# Patient Record
Sex: Female | Born: 1940 | Race: Black or African American | Hispanic: No | Marital: Married | Smoking: Never smoker
Health system: Southern US, Community
[De-identification: ages and names within clinical notes are randomized; demographics above are authoritative.]

## PROBLEM LIST (undated history)

## (undated) DIAGNOSIS — I10 Essential (primary) hypertension: Secondary | ICD-10-CM

## (undated) DIAGNOSIS — E079 Disorder of thyroid, unspecified: Secondary | ICD-10-CM

## (undated) HISTORY — DX: Disorder of thyroid, unspecified: E07.9

## (undated) HISTORY — DX: Essential (primary) hypertension: I10

---

## 1998-05-03 ENCOUNTER — Emergency Department (HOSPITAL_COMMUNITY): Admission: EM | Admit: 1998-05-03 | Discharge: 1998-05-03 | Payer: Self-pay | Admitting: Emergency Medicine

## 1999-04-24 ENCOUNTER — Encounter: Admission: RE | Admit: 1999-04-24 | Discharge: 1999-04-24 | Payer: Self-pay | Admitting: Family Medicine

## 1999-04-24 ENCOUNTER — Encounter: Payer: Self-pay | Admitting: Family Medicine

## 1999-06-04 ENCOUNTER — Other Ambulatory Visit: Admission: RE | Admit: 1999-06-04 | Discharge: 1999-06-04 | Payer: Self-pay | Admitting: Family Medicine

## 2000-07-06 ENCOUNTER — Encounter: Admission: RE | Admit: 2000-07-06 | Discharge: 2000-07-06 | Payer: Self-pay | Admitting: Family Medicine

## 2000-07-06 ENCOUNTER — Encounter: Payer: Self-pay | Admitting: Family Medicine

## 2001-01-17 ENCOUNTER — Encounter: Admission: RE | Admit: 2001-01-17 | Discharge: 2001-01-17 | Payer: Self-pay | Admitting: Family Medicine

## 2001-01-17 ENCOUNTER — Encounter: Payer: Self-pay | Admitting: Family Medicine

## 2003-05-16 ENCOUNTER — Encounter: Admission: RE | Admit: 2003-05-16 | Discharge: 2003-05-16 | Payer: Self-pay | Admitting: Family Medicine

## 2006-10-12 ENCOUNTER — Encounter: Admission: RE | Admit: 2006-10-12 | Discharge: 2006-10-12 | Payer: Self-pay | Admitting: Family Medicine

## 2008-02-08 ENCOUNTER — Encounter: Admission: RE | Admit: 2008-02-08 | Discharge: 2008-02-08 | Payer: Self-pay | Admitting: Family Medicine

## 2009-01-22 ENCOUNTER — Encounter: Admission: RE | Admit: 2009-01-22 | Discharge: 2009-01-22 | Payer: Self-pay | Admitting: Family Medicine

## 2010-03-30 ENCOUNTER — Encounter: Payer: Self-pay | Admitting: Family Medicine

## 2011-08-31 ENCOUNTER — Telehealth: Payer: Self-pay | Admitting: Medical Oncology

## 2011-08-31 NOTE — Telephone Encounter (Signed)
wrong chart

## 2012-05-23 ENCOUNTER — Encounter: Payer: Self-pay | Admitting: Obstetrics and Gynecology

## 2012-12-22 ENCOUNTER — Ambulatory Visit (INDEPENDENT_AMBULATORY_CARE_PROVIDER_SITE_OTHER): Payer: Medicare HMO | Admitting: Obstetrics & Gynecology

## 2012-12-22 ENCOUNTER — Encounter: Payer: Self-pay | Admitting: Obstetrics & Gynecology

## 2012-12-22 VITALS — BP 146/83 | HR 47 | Temp 97.8°F | Ht 63.0 in | Wt 152.6 lb

## 2012-12-22 DIAGNOSIS — N812 Incomplete uterovaginal prolapse: Secondary | ICD-10-CM

## 2012-12-22 NOTE — Progress Notes (Signed)
Patient ID: Colleen Jones, female   DOB: 1940-05-09, 72 y.o.   MRN: [id number]  Chief Complaint  Patient presents with  . Vaginal Bleeding    spotting   11 months of sx of pelvic prolapse, irritation and some spotting. No hematuria, vaginal discharge, hematochezia, pain.  HPI Colleen Jones is a 72 y.o. female.  [id number], LMP age 38.   HPI  Past Medical History  Diagnosis Date  . Hypertension     History reviewed. No pertinent past surgical history.  History reviewed. No pertinent family history.  Social History History  Substance Use Topics  . Smoking status: Never Smoker   . Smokeless tobacco: Never Used  . Alcohol Use: No    Not on File  Current Outpatient Prescriptions  Medication Sig Dispense Refill  . atenolol (TENORMIN) 50 MG tablet Take 50 mg by mouth daily.      . calcium carbonate (OS-CAL) 1250 MG chewable tablet Chew 1 tablet by mouth daily.      . cholecalciferol (VITAMIN D) 1000 UNITS tablet Take 1,000 Units by mouth daily.      Marland Kitchen levothyroxine (SYNTHROID, LEVOTHROID) 75 MCG tablet Take 75 mcg by mouth daily before breakfast.       No current facility-administered medications for this visit.    Review of Systems Review of Systems  Genitourinary: Positive for vaginal bleeding (irritation), difficulty urinating and vaginal pain. Negative for dysuria, urgency, hematuria (spotting), vaginal discharge, menstrual problem and pelvic pain.    Blood pressure 146/83, pulse 47, temperature 97.8 F (36.6 C), temperature source Oral, height 5\' 3"  (1.6 m), weight 152 lb 9.6 oz (69.219 kg).  Physical Exam Physical Exam  Constitutional: She appears well-developed. She appears distressed.  Pulmonary/Chest: No respiratory distress.  Abdominal: Soft. There is no tenderness.  Genitourinary: No vaginal discharge found.  2-3 degree utero vaginal prolapse, no mass  Skin: Skin is warm and dry.  Psychiatric: Her behavior is normal.    Data Reviewed Referral note,  pap  Assessment    Uterovaginal prolapse with irritation and spotting     Plan    Pessary fitting        Lucette Kratz 12/22/2012, 4:16 PM

## 2012-12-22 NOTE — Progress Notes (Signed)
Pt. States she has been spotting since 2013 ("before christmas") and her "femal parts are starting to hang;" reports feeling irritation but no pain.  Pt. States she has not taken ber BP medication today.

## 2012-12-22 NOTE — Patient Instructions (Signed)
Prolapse  Prolapse means the falling down, bulging, dropping, or drooping of a body part. Organs that commonly prolapse include the rectum, small intestine, bladder, urethra, vagina (birth canal), uterus (womb), and cervix. Prolapse occurs when the ligaments and muscle tissue around the rectum, bladder, and uterus are damaged or weakened.  CAUSES  This happens especially with:  Childbirth. Some women feel pelvic pressure or have trouble holding their urine right after childbirth, because of stretching and tearing of pelvic tissues. This generally gets better with time and the feeling usually goes away, but it may return with aging.  Chronic heavy lifting.  Aging.  Menopause, with loss of estrogen production weakening the pelvic ligaments and muscles.  Past pelvic surgery.  Obesity.  Chronic constipation.  Chronic cough. Prolapse may affect a single organ, or several organs may prolapse at the same time. The front wall of the vagina holds up the bladder. The back wall holds up part of the lower intestine, or rectum. The uterus fills a spot in the middle. All these organs can be involved when the ligaments and muscles around the vagina relax too much. This often gets worse when women stop producing estrogen (menopause). SYMPTOMS  Uncontrolled loss of urine (incontinence) with cough, sneeze, straining, and exercise.  More force may be required to have a bowel movement, due to trapping of the stool.  When part of an organ bulges through the opening of the vagina, there is sometimes a feeling of heaviness or pressure. It may feel as though something is falling out. This sensation increases with coughing or bearing down.  If the organs protrude through the opening of the vagina and rub against the clothing, there may be soreness, ulcers, infection, pain, and bleeding.  Lower back pain.  Pushing in the upper or lower part of the vagina, to pass urine or have a bowel movement.  Problems  having sexual intercourse.  Being unable to insert a tampon or applicator. DIAGNOSIS  Usually, a physical exam is all that is needed to identify the problem. During the examination, you may be asked to cough and strain while lying down, sitting up, and standing up. Your caregiver will determine if more testing is required, such as bladder function tests. Some diagnoses are:  Cystocele: Bulging and falling of the bladder into the top of the vagina.  Rectocele: Part of the rectum bulging into the vagina.  Prolapse of the uterus: The uterus falls or drops into the vagina.  Enterocele: Bulging of the top of the vagina, after a hysterectomy (uterus removal), with the small intestine bulging into the vagina. A hernia in the top of the vagina.  Urethrocele: The urethra (urine carrying tube) bulging into the vagina. TREATMENT  In most cases, prolapse needs to be treated only if it produces symptoms. If the symptoms are interfering with your usual daily or sexual activities, treatment may be necessary. The following are some measures that may be used to treat prolapse.  Estrogen may help elderly women with mild prolapse.  Kegel exercises may help mild cases of prolapse, by strengthening and tightening the muscles of the pelvic floor.  Pessaries are used in women who choose not to, or are unable to, have surgery. A pessary is a doughnut-shaped piece of plastic or rubber that is put into the vagina to keep the organs in place. This device must be fitted by your caregiver. Your caregiver will also explain how to care for yourself with the pessary. If it works well for you,   this may be the only treatment required.  Surgery is often the only form of treatment for more severe prolapses. There are different types of surgery available. You should discuss what the best procedure is for you. If the uterus is prolapsed, it may be removed (hysterectomy) as part of the surgical treatment. Your caregiver will  discuss the risks and benefits with you.  Uterine-vaginal suspension (surgery to hold up the organs) may be used, especially if you want to maintain your fertility. No form of treatment is guaranteed to correct the prolapse or relieve the symptoms. HOME CARE INSTRUCTIONS   Wear a sanitary pad or absorbent product if you have incontinence of urine.  Avoid heavy lifting and straining with exercise and work.  Take over-the-counter pain medicine for minor discomfort.  Try taking estrogen or using estrogen vaginal cream.  Try Kegel exercises or use a pessary, before deciding to have surgery.  Do Kegel exercises after having a baby. SEEK MEDICAL CARE IF:   Your symptoms interfere with your daily activities.  You need medicine to help with the discomfort.  You need to be fitted with a pessary.  You notice bleeding from the vagina.  You think you have ulcers or you notice ulcers on the cervix.  You have an oral temperature above 102 F (38.9 C).  You develop pain or blood with urination.  You have bleeding with a bowel movement.  The symptoms are interfering with your sex life.  You have urinary incontinence that interferes with your daily activities.  You lose urine with sexual intercourse.  You have a chronic cough.  You have chronic constipation. Document Released: 08/30/2002 Document Revised: 05/18/2011 Document Reviewed: 03/10/2009 [employer] Patient Information 2014 [employer].  

## 2013-01-16 ENCOUNTER — Ambulatory Visit (INDEPENDENT_AMBULATORY_CARE_PROVIDER_SITE_OTHER): Payer: Medicare HMO | Admitting: Obstetrics & Gynecology

## 2013-01-16 ENCOUNTER — Encounter: Payer: Self-pay | Admitting: Obstetrics & Gynecology

## 2013-01-16 VITALS — BP 127/80 | HR 49 | Temp 97.5°F | Ht 63.0 in | Wt 151.4 lb

## 2013-01-16 DIAGNOSIS — N812 Incomplete uterovaginal prolapse: Secondary | ICD-10-CM

## 2013-01-16 NOTE — Progress Notes (Signed)
Patient ID: Colleen Jones, female   DOB: 27-Apr-1940, 72 y.o.   MRN: [id number] Patient consents to pessary fitting. I explained the procedure. & cm ring with support placed and good fit assured. She will return in 1 week to assess tolerance.    Adam Phenix, MD 01/16/2013 4:48 PM

## 2013-01-26 ENCOUNTER — Ambulatory Visit: Payer: Medicare HMO | Admitting: Obstetrics & Gynecology

## 2013-01-27 ENCOUNTER — Encounter: Payer: Self-pay | Admitting: Obstetrics & Gynecology

## 2013-01-27 ENCOUNTER — Ambulatory Visit (INDEPENDENT_AMBULATORY_CARE_PROVIDER_SITE_OTHER): Payer: Medicare HMO | Admitting: Obstetrics & Gynecology

## 2013-01-27 VITALS — BP 166/80 | HR 47 | Temp 97.1°F | Ht 63.0 in | Wt 155.2 lb

## 2013-01-27 DIAGNOSIS — N812 Incomplete uterovaginal prolapse: Secondary | ICD-10-CM

## 2013-01-27 NOTE — Progress Notes (Signed)
Patient was uncomfortable with pessary, which she removed and brought with her today. I placed a 5 cm ring no support and she will return in 2 weeks for evaluation.  Adam Phenix, MD 01/27/2013

## 2013-01-27 NOTE — Patient Instructions (Signed)
See your instructions given last visit.  Colleen Phenix, MD

## 2013-03-29 ENCOUNTER — Encounter: Payer: Self-pay | Admitting: Obstetrics & Gynecology

## 2013-03-29 ENCOUNTER — Ambulatory Visit (INDEPENDENT_AMBULATORY_CARE_PROVIDER_SITE_OTHER): Payer: Medicare HMO | Admitting: Obstetrics & Gynecology

## 2013-03-29 VITALS — BP 139/74 | HR 52 | Temp 97.5°F | Ht 64.0 in | Wt 152.5 lb

## 2013-03-29 DIAGNOSIS — N812 Incomplete uterovaginal prolapse: Secondary | ICD-10-CM

## 2013-03-29 NOTE — Progress Notes (Signed)
   Subjective:    Patient ID: Colleen Jones, female    DOB: December 22, 1940, 73 y.o.   MRN: [id number]  [id number] No LMP recorded. Patient is postmenopausal. vulvo vaginal prolapse. She kept 5 ring pessary in for 1-2 weeks and removed for discomfort.    Review of Systems  Genitourinary: Positive for vaginal pain. Negative for urgency, vaginal bleeding and vaginal discharge.       Objective:   Physical Exam  Constitutional: She appears well-developed. No distress.  Abdominal: Soft.  Genitourinary:  Uterine prolapse, 4 donut placed  Neurological: She is alert.  Skin: Skin is warm.  Psychiatric: She has a normal mood and affect. Her behavior is normal.          Assessment & Plan:  Pelvic organ prolapse trying pessary, RTC 1 mo  Adam PhenixJames G Arnold, MD 03/29/2013

## 2013-05-15 ENCOUNTER — Ambulatory Visit (INDEPENDENT_AMBULATORY_CARE_PROVIDER_SITE_OTHER): Payer: Commercial Managed Care - HMO | Admitting: Obstetrics & Gynecology

## 2013-05-15 ENCOUNTER — Encounter: Payer: Self-pay | Admitting: Obstetrics & Gynecology

## 2013-05-15 VITALS — BP 129/78 | HR 48 | Temp 97.4°F | Ht 64.0 in | Wt 150.8 lb

## 2013-05-15 DIAGNOSIS — N812 Incomplete uterovaginal prolapse: Secondary | ICD-10-CM

## 2013-05-15 NOTE — Progress Notes (Signed)
Patient ID: [name], female   DOB: 1940-10-02, 73 y.o.   MRN: [id number] Pessary stayed in place for 2 weeks then fell out, no prolapse for another 2 weeks,. She will reinsert at home and return to examine in 3 weeks.  Adam PhenixJames G Cade Dashner, MD 05/15/2013

## 2013-07-17 ENCOUNTER — Other Ambulatory Visit: Payer: Self-pay | Admitting: Medical Oncology

## 2013-07-17 NOTE — Progress Notes (Signed)
erroneous

## 2014-01-08 ENCOUNTER — Encounter: Payer: Self-pay | Admitting: Obstetrics & Gynecology

## 2014-05-08 DIAGNOSIS — H5210 Myopia, unspecified eye: Secondary | ICD-10-CM | POA: Diagnosis not present

## 2014-05-08 DIAGNOSIS — H524 Presbyopia: Secondary | ICD-10-CM | POA: Diagnosis not present

## 2014-11-07 DIAGNOSIS — Z0000 Encounter for general adult medical examination without abnormal findings: Secondary | ICD-10-CM | POA: Diagnosis not present

## 2014-11-07 DIAGNOSIS — Z23 Encounter for immunization: Secondary | ICD-10-CM | POA: Diagnosis not present

## 2014-11-07 DIAGNOSIS — D649 Anemia, unspecified: Secondary | ICD-10-CM | POA: Diagnosis not present

## 2014-11-07 DIAGNOSIS — R7301 Impaired fasting glucose: Secondary | ICD-10-CM | POA: Diagnosis not present

## 2014-11-07 DIAGNOSIS — E785 Hyperlipidemia, unspecified: Secondary | ICD-10-CM | POA: Diagnosis not present

## 2014-11-07 DIAGNOSIS — I10 Essential (primary) hypertension: Secondary | ICD-10-CM | POA: Diagnosis not present

## 2014-11-07 DIAGNOSIS — E039 Hypothyroidism, unspecified: Secondary | ICD-10-CM | POA: Diagnosis not present

## 2014-11-30 DIAGNOSIS — Z1231 Encounter for screening mammogram for malignant neoplasm of breast: Secondary | ICD-10-CM | POA: Diagnosis not present

## 2015-02-06 DIAGNOSIS — E039 Hypothyroidism, unspecified: Secondary | ICD-10-CM | POA: Diagnosis not present

## 2015-02-06 DIAGNOSIS — I10 Essential (primary) hypertension: Secondary | ICD-10-CM | POA: Diagnosis not present

## 2015-05-30 DIAGNOSIS — E039 Hypothyroidism, unspecified: Secondary | ICD-10-CM | POA: Diagnosis not present

## 2015-05-30 DIAGNOSIS — I10 Essential (primary) hypertension: Secondary | ICD-10-CM | POA: Diagnosis not present

## 2015-05-30 DIAGNOSIS — J309 Allergic rhinitis, unspecified: Secondary | ICD-10-CM | POA: Diagnosis not present

## 2015-05-30 DIAGNOSIS — Z049 Encounter for examination and observation for unspecified reason: Secondary | ICD-10-CM | POA: Diagnosis not present

## 2015-12-04 DIAGNOSIS — Z0000 Encounter for general adult medical examination without abnormal findings: Secondary | ICD-10-CM | POA: Diagnosis not present

## 2015-12-04 DIAGNOSIS — D509 Iron deficiency anemia, unspecified: Secondary | ICD-10-CM | POA: Diagnosis not present

## 2015-12-04 DIAGNOSIS — Z23 Encounter for immunization: Secondary | ICD-10-CM | POA: Diagnosis not present

## 2015-12-04 DIAGNOSIS — I10 Essential (primary) hypertension: Secondary | ICD-10-CM | POA: Diagnosis not present

## 2016-01-14 DIAGNOSIS — D649 Anemia, unspecified: Secondary | ICD-10-CM | POA: Diagnosis not present

## 2016-02-11 ENCOUNTER — Telehealth: Payer: Self-pay | Admitting: Hematology and Oncology

## 2016-02-11 ENCOUNTER — Encounter: Payer: Self-pay | Admitting: Hematology and Oncology

## 2016-02-11 NOTE — Telephone Encounter (Signed)
Pt returned call and confirmed appt, verified demo and insurance, mailed pt letter, faxed referring appt date/time. Requested [employer] auth.

## 2016-02-19 ENCOUNTER — Telehealth: Payer: Self-pay | Admitting: Hematology and Oncology

## 2016-02-19 NOTE — Telephone Encounter (Signed)
Lt pt vm regarding [employer] autho, reached out to PCP listed on card and lt vm to obtain [employer] auth. Contacted [employer] insurance to confirm PCP and contact info. (ZOX#[id number](ref#[id number])

## 2016-02-20 ENCOUNTER — Telehealth: Payer: Self-pay | Admitting: Hematology and Oncology

## 2016-02-20 ENCOUNTER — Ambulatory Visit (HOSPITAL_BASED_OUTPATIENT_CLINIC_OR_DEPARTMENT_OTHER): Payer: Commercial Managed Care - HMO | Admitting: Hematology and Oncology

## 2016-02-20 ENCOUNTER — Ambulatory Visit (HOSPITAL_BASED_OUTPATIENT_CLINIC_OR_DEPARTMENT_OTHER): Payer: Commercial Managed Care - HMO

## 2016-02-20 ENCOUNTER — Encounter: Payer: Self-pay | Admitting: Hematology and Oncology

## 2016-02-20 DIAGNOSIS — I10 Essential (primary) hypertension: Secondary | ICD-10-CM

## 2016-02-20 DIAGNOSIS — D509 Iron deficiency anemia, unspecified: Secondary | ICD-10-CM

## 2016-02-20 DIAGNOSIS — D539 Nutritional anemia, unspecified: Secondary | ICD-10-CM | POA: Diagnosis not present

## 2016-02-20 LAB — CBC & DIFF AND RETIC
BASO%: 0.4 % (ref 0.0–2.0)
Basophils Absolute: 0 10*3/uL (ref 0.0–0.1)
EOS%: 1.3 % (ref 0.0–7.0)
Eosinophils Absolute: 0.1 10*3/uL (ref 0.0–0.5)
HCT: 33.8 % — ABNORMAL LOW (ref 34.8–46.6)
HEMOGLOBIN: 10.9 g/dL — AB (ref 11.6–15.9)
IMMATURE RETIC FRACT: 3.4 % (ref 1.60–10.00)
LYMPH%: 46.4 % (ref 14.0–49.7)
MCH: 25 pg — AB (ref 25.1–34.0)
MCHC: 32.2 g/dL (ref 31.5–36.0)
MCV: 77.5 fL — AB (ref 79.5–101.0)
MONO#: 0.3 10*3/uL (ref 0.1–0.9)
MONO%: 5.9 % (ref 0.0–14.0)
NEUT%: 46 % (ref 38.4–76.8)
NEUTROS ABS: 2.1 10*3/uL (ref 1.5–6.5)
PLATELETS: 172 10*3/uL (ref 145–400)
RBC: 4.36 10*6/uL (ref 3.70–5.45)
RDW: 14.6 % — ABNORMAL HIGH (ref 11.2–14.5)
Retic %: 0.84 % (ref 0.70–2.10)
Retic Ct Abs: 36.62 10*3/uL (ref 33.70–90.70)
WBC: 4.6 10*3/uL (ref 3.9–10.3)
lymph#: 2.1 10*3/uL (ref 0.9–3.3)

## 2016-02-20 LAB — COMPREHENSIVE METABOLIC PANEL
ALBUMIN: 3.7 g/dL (ref 3.5–5.0)
ALK PHOS: 75 U/L (ref 40–150)
ALT: 20 U/L (ref 0–55)
ANION GAP: 10 meq/L (ref 3–11)
AST: 28 U/L (ref 5–34)
BILIRUBIN TOTAL: 0.76 mg/dL (ref 0.20–1.20)
BUN: 17.9 mg/dL (ref 7.0–26.0)
CO2: 26 mEq/L (ref 22–29)
Calcium: 9.3 mg/dL (ref 8.4–10.4)
Chloride: 104 mEq/L (ref 98–109)
Creatinine: 1.1 mg/dL (ref 0.6–1.1)
EGFR: 57 mL/min/{1.73_m2} — AB (ref >90)
Glucose: 84 mg/dl (ref 70–140)
POTASSIUM: 3.6 meq/L (ref 3.5–5.1)
Sodium: 140 mEq/L (ref 136–145)
TOTAL PROTEIN: 8 g/dL (ref 6.4–8.3)

## 2016-02-20 NOTE — Progress Notes (Signed)
[facility] CONSULT NOTE  Patient Care Team: Leonard Downing, MD as PCP - General (Family Medicine)  CHIEF COMPLAINTS/PURPOSE OF CONSULTATION:  Chronic anemia  HISTORY OF PRESENTING ILLNESS:  Colleen Jones 75 y.o. female is here because of diagnosis of chronic anemia  She was found to have abnormal CBC from blood count monitoring by her primary care doctor. According to outside records, she has been anemic since 2014.  On 04/28/2012, hemoglobin is 11.1. On 11/07/2014, hemoglobin is 10.9. On 12/04/2015, hemoglobin is 10.9 On 01/14/2016, hemoglobin is 10.4 Hemoglobin electrophoresis showed normal pattern. Reticulocyte count is low. Serum ferritin is at 235 She denies recent chest pain on exertion, shortness of breath on minimal exertion, pre-syncopal episodes, or palpitations. She had not noticed any recent bleeding such as epistaxis, hematuria or hematochezia The patient denies over the counter NSAID ingestion. She is not on antiplatelets agents. Her last colonoscopy was 3 years ago and she was told it was normal She had no prior history or diagnosis of cancer. Her age appropriate screening programs are up-to-date. She denies any pica and eats a variety of diet. She never donated blood or received blood transfusion The patient was prescribed oral iron supplements and she takes them on and off without improvement of her anemia She denies recent changes in appetite or weight. No recent bone pain  MEDICAL HISTORY:  Past Medical History:  Diagnosis Date  . Hypertension   . Thyroid disease     SURGICAL HISTORY: History reviewed. No pertinent surgical history.  SOCIAL HISTORY: Social History   Social History  . Marital status: Married    Spouse name: N/A  . Number of children: 5  . Years of education: N/A   Occupational History  . 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 Smoking status: Never Smoker  . Smokeless tobacco: Never  Used  . Alcohol use No  . Drug use: No  . Sexual activity: Not Currently   Other Topics Concern  . Not on file   Social History Narrative  . No narrative on file    FAMILY HISTORY: History reviewed. No pertinent family history.  ALLERGIES:  has No Known Allergies.  MEDICATIONS:  Current Outpatient Prescriptions  Medication Sig Dispense Refill  . atenolol (TENORMIN) 50 MG tablet Take 100 mg by mouth daily.     . calcium carbonate (OS-CAL) 1250 MG chewable tablet Chew 1 tablet by mouth daily.    . cholecalciferol (VITAMIN D) 1000 UNITS tablet Take 50,000 Units by mouth daily.     Marland Kitchen levothyroxine (SYNTHROID, LEVOTHROID) 75 MCG tablet Take 75 mcg by mouth daily before breakfast.    . triamterene-hydrochlorothiazide (MAXZIDE) 75-50 MG per tablet Take 0.5 tablets by mouth daily.     No current facility-administered medications for this visit.     REVIEW OF SYSTEMS:   Constitutional: Denies fevers, chills or abnormal night sweats Eyes: Denies blurriness of vision, double vision or watery eyes Ears, nose, mouth, throat, and face: Denies mucositis or sore throat Respiratory: Denies cough, dyspnea or wheezes Cardiovascular: Denies palpitation, chest discomfort or lower extremity swelling Gastrointestinal:  Denies nausea, heartburn or change in bowel habits Skin: Denies abnormal skin rashes Lymphatics: Denies new lymphadenopathy or easy bruising Neurological:Denies numbness, tingling or new weaknesses Behavioral/Psych: Mood is stable, no new changes  All other systems were reviewed with the patient and are negative.  PHYSICAL EXAMINATION: ECOG PERFORMANCE STATUS: 0 - Asymptomatic  Vitals:  02/20/16 1346  BP: (!) 183/75  Pulse: (!) 51  Resp: 18  Temp: 98 F (36.7 C)   Filed Weights   02/20/16 1346  Weight: 151 lb 1.6 oz (68.5 kg)    GENERAL:alert, no distress and comfortable SKIN: skin color, texture, turgor are normal, no rashes or significant lesions EYES: normal,  conjunctiva are pink and non-injected, sclera clear OROPHARYNX:no exudate, no erythema and lips, buccal mucosa, and tongue normal  NECK: supple, thyroid normal size, non-tender, without nodularity LYMPH:  no palpable lymphadenopathy in the cervical, axillary or inguinal LUNGS: clear to auscultation and percussion with normal breathing effort HEART: regular rate & rhythm and no murmurs and no lower extremity edema ABDOMEN:abdomen soft, non-tender and normal bowel sounds Musculoskeletal:no cyanosis of digits and no clubbing  PSYCH: alert & oriented x 3 with fluent speech NEURO: no focal motor/sensory deficits  ASSESSMENT & PLAN:  Microcytic anemia The patient had microcytic anemia. Serum hemoglobin electrophoresis was done and it was within normal limits. Serum ferritin was within normal limits but serum iron, TIBC and iron saturation was not done. She could have undiagnosed thalassemia trait. Anemia of chronic disease can cause microcytic anemia as well. Concurrent vitamin B 12 cannot be excluded. With patient with progressive anemia, I would be concerned about possible undiagnosed malignancy such as multiple myeloma I will order additional workup and see her back next week to review test results   Essential hypertension she will continue current medical management. I recommend close follow-up with primary care doctor for medication adjustment.   Orders Placed This Encounter  Procedures  . CBC & Diff and Retic    Standing Status:   Future    Number of Occurrences:   1    Standing Expiration Date:   03/26/2017  . Ferritin    Standing Status:   Future    Number of Occurrences:   1    Standing Expiration Date:   03/26/2017  . Iron and TIBC    Standing Status:   Future    Number of Occurrences:   1    Standing Expiration Date:   03/26/2017  . Vitamin B12    Standing Status:   Future    Number of Occurrences:   1    Standing Expiration Date:   03/26/2017  . Sedimentation rate     Standing Status:   Future    Number of Occurrences:   1    Standing Expiration Date:   03/26/2017  . Comprehensive metabolic panel    Standing Status:   Future    Number of Occurrences:   1    Standing Expiration Date:   03/26/2017  . Multiple Myeloma Panel (SPEP&IFE w/QIG)    Standing Status:   Future    Number of Occurrences:   1    Standing Expiration Date:   03/26/2017      All questions were answered. The patient knows to call the clinic with any problems, questions or concerns. I spent 30 minutes counseling the patient face to face. The total time spent in the appointment was 55 minutes and more than 50% was on counseling.     Heath Lark, MD 02/20/16 3:00 PM

## 2016-02-20 NOTE — Assessment & Plan Note (Addendum)
The patient had microcytic anemia. Serum hemoglobin electrophoresis was done and it was within normal limits. Serum ferritin was within normal limits but serum iron, TIBC and iron saturation was not done. She could have undiagnosed thalassemia trait. Anemia of chronic disease can cause microcytic anemia as well. Concurrent vitamin B 12 cannot be excluded. With patient with progressive anemia, I would be concerned about possible undiagnosed malignancy such as multiple myeloma I will order additional workup and see her back next week to review test results

## 2016-02-20 NOTE — Telephone Encounter (Signed)
Appointments scheduled per 12/14 LOS patient given AVS report and calendars with future scheduled appointments.  °

## 2016-02-20 NOTE — Assessment & Plan Note (Signed)
she will continue current medical management. I recommend close follow-up with primary care doctor for medication adjustment.  

## 2016-02-21 LAB — IRON AND TIBC
%SAT: 29 % (ref 21–57)
IRON: 90 ug/dL (ref 41–142)
TIBC: 313 ug/dL (ref 236–444)
UIBC: 222 ug/dL (ref 120–384)

## 2016-02-21 LAB — SEDIMENTATION RATE: SED RATE: 39 mm/h (ref 0–40)

## 2016-02-21 LAB — VITAMIN B12: VITAMIN B 12: 531 pg/mL (ref 232–1245)

## 2016-02-21 LAB — FERRITIN: Ferritin: 164 ng/ml (ref 9–269)

## 2016-02-25 LAB — MULTIPLE MYELOMA PANEL, SERUM
ALBUMIN/GLOB SERPL: 1.1 (ref 0.7–1.7)
ALPHA2 GLOB SERPL ELPH-MCNC: 0.7 g/dL (ref 0.4–1.0)
Albumin SerPl Elph-Mcnc: 3.5 g/dL (ref 2.9–4.4)
Alpha 1: 0.2 g/dL (ref 0.0–0.4)
B-GLOBULIN SERPL ELPH-MCNC: 1.1 g/dL (ref 0.7–1.3)
Gamma Glob SerPl Elph-Mcnc: 1.4 g/dL (ref 0.4–1.8)
Globulin, Total: 3.5 g/dL (ref 2.2–3.9)
IGM (IMMUNOGLOBIN M), SRM: 66 mg/dL (ref 26–217)
IgA, Qn, Serum: 264 mg/dL (ref 64–422)
Total Protein: 7 g/dL (ref 6.0–8.5)

## 2016-02-27 ENCOUNTER — Ambulatory Visit (HOSPITAL_BASED_OUTPATIENT_CLINIC_OR_DEPARTMENT_OTHER): Payer: Commercial Managed Care - HMO | Admitting: Hematology and Oncology

## 2016-02-27 DIAGNOSIS — I10 Essential (primary) hypertension: Secondary | ICD-10-CM | POA: Diagnosis not present

## 2016-02-27 DIAGNOSIS — D509 Iron deficiency anemia, unspecified: Secondary | ICD-10-CM

## 2016-02-28 ENCOUNTER — Encounter: Payer: Self-pay | Admitting: Hematology and Oncology

## 2016-02-28 NOTE — Progress Notes (Signed)
[facility] OFF[facility]ESS [facility]  Kaleen MaskELKINS,WILSON OLIVER, MD SUMMARY OF HEMATOLOGIC HISTORY:  Colleen Jones 75 y.o. female is here because of diagnosis of chronic anemia  She was found to have abnormal CBC from blood count monitoring by her primary care doctor. According to outside records, she has been anemic since 2014.  On 04/28/2012, hemoglobin is 11.1. On 11/07/2014, hemoglobin is 10.9. On 12/04/2015, hemoglobin is 10.9 On 01/14/2016, hemoglobin is 10.4 Hemoglobin electrophoresis showed normal pattern. Reticulocyte count is low. Serum ferritin is at 235 She denies recent chest pain on exertion, shortness of breath on minimal exertion, pre-syncopal episodes, or palpitations. She had not noticed any recent bleeding such as epistaxis, hematuria or hematochezia The patient denies over the counter NSAID ingestion. She is not on antiplatelets agents. Her last colonoscopy was 3 years ago and she was told it was normal She had no prior history or diagnosis of cancer. Her age appropriate screening programs are up-to-date. She denies any pica and eats a variety of diet. She never donated blood or received blood transfusion The patient was prescribed oral iron supplements and she takes them on and off without improvement of her anemia She denies recent changes in appetite or weight. No recent bone pain  INTERVAL HISTORY: Colleen Jones 75 y.o. female returns for further follow-up. She feels well.  I have reviewed the past medical history, past surgical history, social history and family history with the patient and they are unchanged from previous note.  ALLERGIES:  has No Known Allergies.  MEDICATIONS:  Current Outpatient Prescriptions  Medication Sig Dispense Refill  . atenolol (TENORMIN) 50 MG tablet Take 100 mg by mouth daily.     . calcium carbonate (OS-CAL) 1250 MG chewable tablet Chew 1 tablet by mouth daily.    . cholecalciferol (VITAMIN D) 1000 UNITS tablet Take  50,000 Units by mouth daily.     Marland Kitchen. levothyroxine (SYNTHROID, LEVOTHROID) 75 MCG tablet Take 75 mcg by mouth daily before breakfast.    . triamterene-hydrochlorothiazide (MAXZIDE) 75-50 MG per tablet Take 0.5 tablets by mouth daily.     No current facility-administered medications for this visit.      REVIEW OF SYSTEMS:   Constitutional: Denies fevers, chills or night sweats Eyes: Denies blurriness of vision Ears, nose, mouth, throat, and face: Denies mucositis or sore throat Respiratory: Denies cough, dyspnea or wheezes Cardiovascular: Denies palpitation, chest discomfort or lower extremity swelling Gastrointestinal:  Denies nausea, heartburn or change in bowel habits Skin: Denies abnormal skin rashes Lymphatics: Denies new lymphadenopathy or easy bruising Neurological:Denies numbness, tingling or new weaknesses Behavioral/Psych: Mood is stable, no new changes  All other systems were reviewed with the patient and are negative.  PHYSICAL EXAMINATION: ECOG PERFORMANCE STATUS: 0 - Asymptomatic  Vitals:   02/27/16 1205  BP: (!) 172/68  Pulse: (!) 51  Resp: 18  Temp: 98.3 F (36.8 C)   Filed Weights   02/27/16 1205  Weight: 151 lb 12.8 oz (68.9 kg)    GENERAL:alert, no distress and comfortable SKIN: skin color, texture, turgor are normal, no rashes or significant lesions EYES: normal, Conjunctiva are pink and non-injected, sclera clear Musculoskeletal:no cyanosis of digits and no clubbing  NEURO: alert & oriented x 3 with fluent speech, no focal motor/sensory deficits  LABORATORY DATA:  I have reviewed the data as listed     Component Value Date/Time   NA 140 02/20/2016 1447   K 3.6 02/20/2016 1447   CO2 26 02/20/2016 1447  GLUCOSE 84 02/20/2016 1447   BUN 17.9 02/20/2016 1447   CREATININE 1.1 02/20/2016 1447   CALCIUM 9.3 02/20/2016 1447   PROT 8.0 02/20/2016 1447   ALBUMIN 3.7 02/20/2016 1447   AST 28 02/20/2016 1447   ALT 20 02/20/2016 1447   ALKPHOS 75  02/20/2016 1447   BILITOT 0.76 02/20/2016 1447    No results found for: SPEP, UPEP  Lab Results  Component Value Date   WBC 4.6 02/20/2016   NEUTROABS 2.1 02/20/2016   HGB 10.9 (L) 02/20/2016   HCT 33.8 (L) 02/20/2016   MCV 77.5 (L) 02/20/2016   PLT 172 02/20/2016      Chemistry      Component Value Date/Time   NA 140 02/20/2016 1447   K 3.6 02/20/2016 1447   CO2 26 02/20/2016 1447   BUN 17.9 02/20/2016 1447   CREATININE 1.1 02/20/2016 1447      Component Value Date/Time   CALCIUM 9.3 02/20/2016 1447   ALKPHOS 75 02/20/2016 1447   AST 28 02/20/2016 1447   ALT 20 02/20/2016 1447   BILITOT 0.76 02/20/2016 1447       ASSESSMENT & PLAN:  Microcytic anemia All her workup so far is unremarkable. Repeat iron studies, myeloma panel and a few other workup were all unremarkable I think the microcytic anemia is not from iron deficiency but could be due to thalassemia trait. She is not symptomatic. She does not need future follow-up appointments.  Essential hypertension she will continue current medical management. I recommend close follow-up with primary care doctor for medication adjustment.    No orders of the defined types were placed in this encounter.   All questions were answered. The patient knows to call the clinic with any problems, questions or concerns. No barriers to learning was detected.  I spent 0 minutes counseling the patient face to face. The total time spent in the appointment was 12 minutes and more than 50% was on counseling.     Artis DelayNi Slade Pierpoint, MD 12/22/201712:41 PM

## 2016-02-28 NOTE — Assessment & Plan Note (Addendum)
All her workup so far is unremarkable. Repeat iron studies, myeloma panel and a few other workup were all unremarkable I think the microcytic anemia is not from iron deficiency but could be due to thalassemia trait. She is not symptomatic. She does not need future follow-up appointments.

## 2016-02-28 NOTE — Assessment & Plan Note (Signed)
she will continue current medical management. I recommend close follow-up with primary care doctor for medication adjustment.  

## 2016-03-10 DIAGNOSIS — K006 Disturbances in tooth eruption: Secondary | ICD-10-CM | POA: Diagnosis not present

## 2016-04-14 DIAGNOSIS — H524 Presbyopia: Secondary | ICD-10-CM | POA: Diagnosis not present

## 2016-04-14 DIAGNOSIS — Z0100 Encounter for examination of eyes and vision without abnormal findings: Secondary | ICD-10-CM | POA: Diagnosis not present

## 2016-06-16 DIAGNOSIS — E039 Hypothyroidism, unspecified: Secondary | ICD-10-CM | POA: Diagnosis not present

## 2016-07-31 ENCOUNTER — Ambulatory Visit (INDEPENDENT_AMBULATORY_CARE_PROVIDER_SITE_OTHER): Payer: Medicare HMO | Admitting: Obstetrics & Gynecology

## 2016-07-31 VITALS — BP 160/68 | HR 72 | Wt 150.0 lb

## 2016-07-31 DIAGNOSIS — N812 Incomplete uterovaginal prolapse: Secondary | ICD-10-CM | POA: Diagnosis not present

## 2016-07-31 NOTE — Patient Instructions (Signed)
About Pelvic Support Problems  Pelvic Support Problems Explained Ligaments, muscles, and connective tissue normally hold your bladder, uterus, and other organs in their proper places in your pelvis. When these tissues become weak, a problem with pelvic support may result. Weak support can cause one or more of the pelvic organs to drop down into the vagina. An organ may even drop so far that is partially exposed outside the body.  Pelvic support problems are named by the change in the organ. The main types of pelvic support problems are:  . Cystocele: When the bladder drops down into your vagina.  . Enterocele: When your small intestine drops between your vagina and rectum.  . Rectocele: When your rectum bulges into the vaginal wall.  . Uterine prolapse: When your uterus drops into your vagina.  . Vaginal prolapse: When the top part of the vagina begins to droop. This sometimes happens after a hysterectomy (removal of the uterus).   Causes Pelvic support problems can be caused by many conditions. They may begin after you give birth, especially if you had a large baby. During childbirth, the muscles and skin of the birth canal (vagina) are stretched and sometimes torn. They heal over time but are not always exactly the same. A long pushing stage of labor may also weaken these tissues as well as very rapid births as the tissues do not have time to stretch so they tear.  Also, after menopause, there are changes in the vaginal walls resulting from a decrease in estrogen. Estrogen helps to keep the tissues toned. Low levels of estrogen weaken the vaginal walls and may cause the bladder to shift from its normal position. As women get older, the loss of muscle tone and the relaxation of muscles may cause the uterus or other organs to drop.  Over time, conditions like chronic coughing, chronic constipation, doing a lot of heavy lifting, straining to pass stool, and obesity, can also weaken the pelvic support  muscles.   Diagnosing Pelvic Support Problems Your health care provider will ask about your symptoms and do a pelvic examination. Your provider may also do a rectal exam during your pelvic exam. Your provider may ask you to: 1. Bear down and push (like you are having a bowel movement) so he or she can see if your bladder or other part of your body protrudes into the vagina. 2. Contract the muscles of your pelvis to check the strength of your pelvic muscles.  3. Do several types of urine, nerve and muscle tests of the pelvis and around the bladder to see what type of treatment is best for you.   Symptoms Symptoms of pelvic support problems depend on the organ involved, but may include:  . urine leakage  . stain or fecal loss after a bowel movement . trouble having bowel movements  . ache in the lower abdomen, groin, or lower back  . bladder infection  . a feeling of heaviness, pulling, or fullness in the pelvis, or a feeling that something is falling out of the vagina  . an organ protruding from your vaginal opening  . feeling the need to support the organs or perineal area to empty bladder or bowels . painful sexual intercourse.  Many women feel pelvic pressure or trouble holding their urine immediately after childbirth. For some, these symptoms go away permanently, in others they return as they get older.  Treatment Options A prolapsed organ cannot repair itself. Contact your health care provider as soon as   you notice symptoms of a problem. Treatment depends on what the specific problem is and how far advanced it is.  . The symptoms caused by some pelvic support problems may simply be treated with changes in diet, medicine to soften the stool, weight loss, or avoiding strenuous activities. You may also do pelvic floor exercises to help strengthen your pelvic muscles.  . Some cases of prolapse may require a special support device made from plastic or rubber called a pessary that fits into the  vagina to support the uterus, vagina, or bladder. A pessary can also help women who leak urine when coughing, straining, or exercising. In mild cases, a tampon or vaginal diaphragm may be used instead of a pessary.  Talk to your doctor or health care provider about these options. . In serious cases, surgery may be needed to put the organs back into their proper place. The uterus may be removed because of the pressure it puts on the bladder.  Your doctor will know what surgery will be best for you. How can I prevent pelvic support problems?  You can help prevent pelvic support problems by:  . maintaining a healthy lifestyle  . continuing to do pelvic floor exercises after you deliver a baby  . maintaining a healthy weight  . avoiding a lot of heavy lifting and lifting with your legs (not from your waist)  . treating constipation and avoid getting constipated by eating high fiber foods.    2007, [employer]32  

## 2016-07-31 NOTE — Progress Notes (Signed)
Patient ID: Colleen Jones, female   DOB: [birth year], 76 y.o.   MRN: [id number]  Chief Complaint  Patient presents with  . VAGINAL ISSUE   Vaginal prolapse and irritation  HPI Colleen Jones is a 76 y.o. female.  [id number] Colleen Jones used a pessary for uterovaginal prolapse for several months but discontinued its use more than a year ago due to irritation. She is now ready to seek surgical management. HPI  Past Medical History:  Diagnosis Date  . Hypertension   . Thyroid disease     No past surgical history on file.  No family history on file.  Social History Social History  Substance Use Topics  . Smoking status: Never Smoker  . Smokeless tobacco: Never Used  . Alcohol use No    No Known Allergies  Current Outpatient Prescriptions  Medication Sig Dispense Refill  . atenolol (TENORMIN) 50 MG tablet Take 100 mg by mouth daily.     . calcium carbonate (OS-CAL) 1250 MG chewable tablet Chew 1 tablet by mouth daily.    . cholecalciferol (VITAMIN D) 1000 UNITS tablet Take 50,000 Units by mouth daily.     Marland Kitchen. levothyroxine (SYNTHROID, LEVOTHROID) 75 MCG tablet Take 75 mcg by mouth daily before breakfast.    . triamterene-hydrochlorothiazide (MAXZIDE) 75-50 MG per tablet Take 0.5 tablets by mouth daily.     No current facility-administered medications for this visit.     Review of Systems Review of Systems  Constitutional: Negative.   Gastrointestinal: Negative.   Genitourinary: Positive for vaginal discharge and vaginal pain.    Blood pressure (!) 160/68, pulse 72, weight 150 lb (68 kg).  Physical Exam Physical Exam  Constitutional: She appears well-developed. No distress.  Cardiovascular: Normal rate.   Pulmonary/Chest: Effort normal.  Genitourinary:  Genitourinary Comments: 2-3 degree utero vaginal prolapse, no mass, cervix is keritinized  Skin: Skin is warm and dry.  Psychiatric: She has a normal mood and affect. Her behavior is normal.      Assessment     Uterovaginal prolapse with failed management with pessary    Plan    Referral to Dr C. Ashley RoyaltyMatthews at [facility]       Scheryl DarterJames Tarsha Blando 07/31/2016, 9:40 AM

## 2016-09-14 DIAGNOSIS — Z01419 Encounter for gynecological examination (general) (routine) without abnormal findings: Secondary | ICD-10-CM | POA: Diagnosis not present

## 2016-09-14 DIAGNOSIS — N813 Complete uterovaginal prolapse: Secondary | ICD-10-CM | POA: Diagnosis not present

## 2016-09-14 DIAGNOSIS — N952 Postmenopausal atrophic vaginitis: Secondary | ICD-10-CM | POA: Diagnosis not present

## 2016-10-19 DIAGNOSIS — N949 Unspecified condition associated with female genital organs and menstrual cycle: Secondary | ICD-10-CM | POA: Diagnosis not present

## 2016-10-19 DIAGNOSIS — N812 Incomplete uterovaginal prolapse: Secondary | ICD-10-CM | POA: Diagnosis not present

## 2016-11-16 DIAGNOSIS — N812 Incomplete uterovaginal prolapse: Secondary | ICD-10-CM | POA: Diagnosis not present

## 2016-11-16 DIAGNOSIS — R339 Retention of urine, unspecified: Secondary | ICD-10-CM | POA: Diagnosis not present

## 2016-11-24 DIAGNOSIS — Z23 Encounter for immunization: Secondary | ICD-10-CM | POA: Diagnosis not present

## 2016-11-24 DIAGNOSIS — R7301 Impaired fasting glucose: Secondary | ICD-10-CM | POA: Diagnosis not present

## 2016-11-24 DIAGNOSIS — E039 Hypothyroidism, unspecified: Secondary | ICD-10-CM | POA: Diagnosis not present

## 2016-11-24 DIAGNOSIS — I10 Essential (primary) hypertension: Secondary | ICD-10-CM | POA: Diagnosis not present

## 2016-11-24 DIAGNOSIS — D649 Anemia, unspecified: Secondary | ICD-10-CM | POA: Diagnosis not present

## 2016-11-27 DIAGNOSIS — N800 Endometriosis of uterus: Secondary | ICD-10-CM | POA: Diagnosis not present

## 2016-11-27 DIAGNOSIS — N952 Postmenopausal atrophic vaginitis: Secondary | ICD-10-CM | POA: Diagnosis not present

## 2016-11-27 DIAGNOSIS — N8189 Other female genital prolapse: Secondary | ICD-10-CM | POA: Diagnosis not present

## 2016-11-27 DIAGNOSIS — N812 Incomplete uterovaginal prolapse: Secondary | ICD-10-CM | POA: Diagnosis not present

## 2016-11-27 DIAGNOSIS — N813 Complete uterovaginal prolapse: Secondary | ICD-10-CM | POA: Diagnosis not present

## 2016-11-28 DIAGNOSIS — N952 Postmenopausal atrophic vaginitis: Secondary | ICD-10-CM | POA: Diagnosis not present

## 2016-11-28 DIAGNOSIS — N800 Endometriosis of uterus: Secondary | ICD-10-CM | POA: Diagnosis not present

## 2016-11-28 DIAGNOSIS — N8189 Other female genital prolapse: Secondary | ICD-10-CM | POA: Diagnosis not present

## 2016-11-29 DIAGNOSIS — N952 Postmenopausal atrophic vaginitis: Secondary | ICD-10-CM | POA: Diagnosis not present

## 2016-11-29 DIAGNOSIS — N800 Endometriosis of uterus: Secondary | ICD-10-CM | POA: Diagnosis not present

## 2016-11-29 DIAGNOSIS — N8189 Other female genital prolapse: Secondary | ICD-10-CM | POA: Diagnosis not present

## 2016-12-03 DIAGNOSIS — R339 Retention of urine, unspecified: Secondary | ICD-10-CM | POA: Diagnosis not present

## 2017-02-08 DIAGNOSIS — K006 Disturbances in tooth eruption: Secondary | ICD-10-CM | POA: Diagnosis not present

## 2017-11-17 DIAGNOSIS — E039 Hypothyroidism, unspecified: Secondary | ICD-10-CM | POA: Diagnosis not present

## 2017-11-17 DIAGNOSIS — K219 Gastro-esophageal reflux disease without esophagitis: Secondary | ICD-10-CM | POA: Diagnosis not present

## 2017-11-17 DIAGNOSIS — I10 Essential (primary) hypertension: Secondary | ICD-10-CM | POA: Diagnosis not present

## 2017-12-13 DIAGNOSIS — L280 Lichen simplex chronicus: Secondary | ICD-10-CM | POA: Diagnosis not present

## 2017-12-13 DIAGNOSIS — N9089 Other specified noninflammatory disorders of vulva and perineum: Secondary | ICD-10-CM | POA: Diagnosis not present

## 2017-12-13 DIAGNOSIS — N8111 Cystocele, midline: Secondary | ICD-10-CM | POA: Diagnosis not present

## 2017-12-13 DIAGNOSIS — N952 Postmenopausal atrophic vaginitis: Secondary | ICD-10-CM | POA: Diagnosis not present

## 2018-04-25 DIAGNOSIS — Z23 Encounter for immunization: Secondary | ICD-10-CM | POA: Diagnosis not present

## 2018-11-02 DIAGNOSIS — Z0100 Encounter for examination of eyes and vision without abnormal findings: Secondary | ICD-10-CM | POA: Diagnosis not present

## 2018-11-02 DIAGNOSIS — H2513 Age-related nuclear cataract, bilateral: Secondary | ICD-10-CM | POA: Diagnosis not present

## 2018-12-26 DIAGNOSIS — N9089 Other specified noninflammatory disorders of vulva and perineum: Secondary | ICD-10-CM | POA: Diagnosis not present

## 2018-12-26 DIAGNOSIS — N812 Incomplete uterovaginal prolapse: Secondary | ICD-10-CM | POA: Diagnosis not present

## 2019-02-06 DIAGNOSIS — Z20828 Contact with and (suspected) exposure to other viral communicable diseases: Secondary | ICD-10-CM | POA: Diagnosis not present

## 2019-03-08 DIAGNOSIS — E039 Hypothyroidism, unspecified: Secondary | ICD-10-CM | POA: Diagnosis not present

## 2019-03-08 DIAGNOSIS — I10 Essential (primary) hypertension: Secondary | ICD-10-CM | POA: Diagnosis not present

## 2019-03-08 DIAGNOSIS — D649 Anemia, unspecified: Secondary | ICD-10-CM | POA: Diagnosis not present

## 2019-03-08 DIAGNOSIS — Z23 Encounter for immunization: Secondary | ICD-10-CM | POA: Diagnosis not present

## 2019-04-26 DIAGNOSIS — M25551 Pain in right hip: Secondary | ICD-10-CM | POA: Diagnosis not present

## 2019-04-26 DIAGNOSIS — K5900 Constipation, unspecified: Secondary | ICD-10-CM | POA: Diagnosis not present

## 2019-04-26 DIAGNOSIS — M545 Low back pain: Secondary | ICD-10-CM | POA: Diagnosis not present

## 2019-04-26 DIAGNOSIS — I10 Essential (primary) hypertension: Secondary | ICD-10-CM | POA: Diagnosis not present

## 2019-04-26 DIAGNOSIS — E039 Hypothyroidism, unspecified: Secondary | ICD-10-CM | POA: Diagnosis not present

## 2019-06-26 DIAGNOSIS — Z4689 Encounter for fitting and adjustment of other specified devices: Secondary | ICD-10-CM | POA: Diagnosis not present

## 2019-06-26 DIAGNOSIS — N952 Postmenopausal atrophic vaginitis: Secondary | ICD-10-CM | POA: Diagnosis not present

## 2019-06-26 DIAGNOSIS — N9089 Other specified noninflammatory disorders of vulva and perineum: Secondary | ICD-10-CM | POA: Diagnosis not present

## 2019-06-26 DIAGNOSIS — N8111 Cystocele, midline: Secondary | ICD-10-CM | POA: Diagnosis not present

## 2019-07-31 DIAGNOSIS — D649 Anemia, unspecified: Secondary | ICD-10-CM | POA: Diagnosis not present

## 2019-07-31 DIAGNOSIS — E0590 Thyrotoxicosis, unspecified without thyrotoxic crisis or storm: Secondary | ICD-10-CM | POA: Diagnosis not present

## 2019-07-31 DIAGNOSIS — I10 Essential (primary) hypertension: Secondary | ICD-10-CM | POA: Diagnosis not present

## 2019-08-14 DIAGNOSIS — D649 Anemia, unspecified: Secondary | ICD-10-CM | POA: Diagnosis not present

## 2019-11-02 DIAGNOSIS — I10 Essential (primary) hypertension: Secondary | ICD-10-CM | POA: Diagnosis not present

## 2019-11-02 DIAGNOSIS — D649 Anemia, unspecified: Secondary | ICD-10-CM | POA: Diagnosis not present

## 2019-11-03 ENCOUNTER — Other Ambulatory Visit: Payer: Self-pay | Admitting: Family Medicine

## 2019-11-03 DIAGNOSIS — Z1231 Encounter for screening mammogram for malignant neoplasm of breast: Secondary | ICD-10-CM | POA: Diagnosis not present

## 2019-12-01 DIAGNOSIS — Z1152 Encounter for screening for COVID-19: Secondary | ICD-10-CM | POA: Diagnosis not present

## 2019-12-01 DIAGNOSIS — R5383 Other fatigue: Secondary | ICD-10-CM | POA: Diagnosis not present

## 2019-12-01 DIAGNOSIS — I10 Essential (primary) hypertension: Secondary | ICD-10-CM | POA: Diagnosis not present

## 2019-12-21 ENCOUNTER — Other Ambulatory Visit: Payer: Self-pay | Admitting: Family Medicine

## 2019-12-21 DIAGNOSIS — Z23 Encounter for immunization: Secondary | ICD-10-CM | POA: Diagnosis not present

## 2019-12-21 DIAGNOSIS — R051 Acute cough: Secondary | ICD-10-CM | POA: Diagnosis not present

## 2019-12-21 DIAGNOSIS — R4781 Slurred speech: Secondary | ICD-10-CM

## 2019-12-21 DIAGNOSIS — R4182 Altered mental status, unspecified: Secondary | ICD-10-CM | POA: Diagnosis not present

## 2019-12-21 DIAGNOSIS — R05 Cough: Secondary | ICD-10-CM | POA: Diagnosis not present

## 2019-12-21 DIAGNOSIS — R404 Transient alteration of awareness: Secondary | ICD-10-CM

## 2019-12-21 DIAGNOSIS — R059 Cough, unspecified: Secondary | ICD-10-CM | POA: Diagnosis not present

## 2020-01-14 ENCOUNTER — Other Ambulatory Visit: Payer: Medicare HMO

## 2020-01-16 ENCOUNTER — Other Ambulatory Visit: Payer: Self-pay

## 2020-01-16 ENCOUNTER — Other Ambulatory Visit: Payer: Self-pay | Admitting: Family Medicine

## 2020-01-16 ENCOUNTER — Ambulatory Visit
Admission: RE | Admit: 2020-01-16 | Discharge: 2020-01-16 | Disposition: A | Discharge status: Home / Self Care | Payer: Medicare HMO | Source: Ambulatory Visit | Attending: Family Medicine | Admitting: Family Medicine

## 2020-01-16 DIAGNOSIS — R059 Cough, unspecified: Secondary | ICD-10-CM

## 2020-01-20 ENCOUNTER — Ambulatory Visit
Admission: RE | Admit: 2020-01-20 | Discharge: 2020-01-20 | Disposition: A | Discharge status: Home / Self Care | Payer: Medicare HMO | Source: Ambulatory Visit | Attending: Family Medicine | Admitting: Family Medicine

## 2020-01-20 DIAGNOSIS — R4781 Slurred speech: Secondary | ICD-10-CM

## 2020-01-20 DIAGNOSIS — R404 Transient alteration of awareness: Secondary | ICD-10-CM

## 2020-01-23 DIAGNOSIS — G319 Degenerative disease of nervous system, unspecified: Secondary | ICD-10-CM | POA: Diagnosis not present

## 2020-01-23 DIAGNOSIS — R4781 Slurred speech: Secondary | ICD-10-CM | POA: Diagnosis not present

## 2020-01-23 DIAGNOSIS — R404 Transient alteration of awareness: Secondary | ICD-10-CM | POA: Diagnosis not present

## 2020-01-23 DIAGNOSIS — I6782 Cerebral ischemia: Secondary | ICD-10-CM | POA: Diagnosis not present

## 2021-03-20 IMAGING — MR MR HEAD W/O CM
10 series · 48 of 48 positions shown · non-contrast
Comparison: No pertinent prior exams are available for comparison.

CLINICAL DATA: Slurred speech, transient alteration of awareness.
Additional history provided by scanning technologist: Slurred speech
and confusion.

EXAM:
MRI HEAD WITHOUT CONTRAST
TECHNIQUE: Multiplanar, multiecho pulse sequences of the brain and surrounding
structures were obtained without intravenous contrast.

[Series 2: T1 · sagittal · 5.0mm · 0.45mm/px · 2 of 21 slices shown]
[im 1/21]
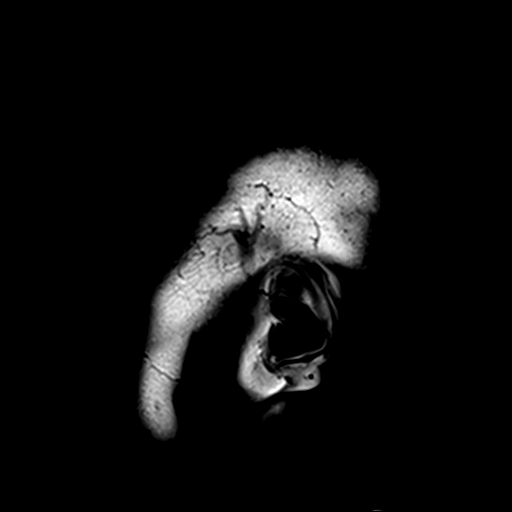
[im 21/21]
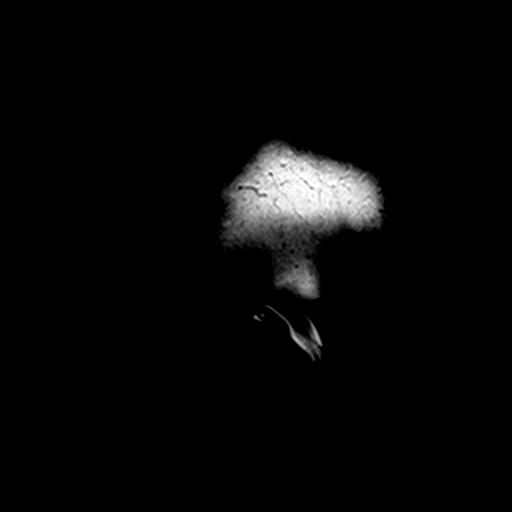

[Series 3: DWI · axial · 3.0mm · 1.80mm/px · z∈[-67,+80]mm · 9 of 99 slices shown (1 of 4)]
[im 1/99]
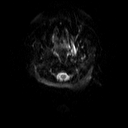
[im 13/99]
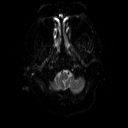
[im 25/99]
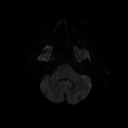
[im 37/99]
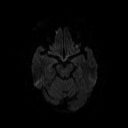
[im 50/99]
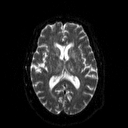
[im 62/99]
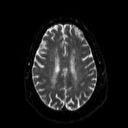
[im 74/99]
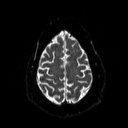
[im 86/99]
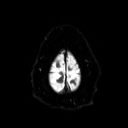
[im 99/99]
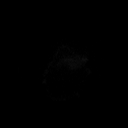

[Series 4: DWI · axial · 3.0mm · 1.80mm/px · z∈[-67,+80]mm · 5 of 50 slices shown (2 of 4)]
[im 1/50]
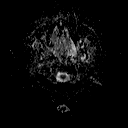
[im 13/50]
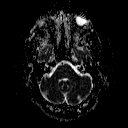
[im 25/50]
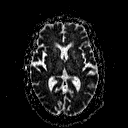
[im 37/50]
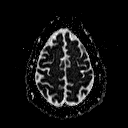
[im 50/50]
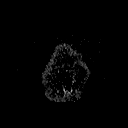

[Series 5: DWI · coronal · 5.0mm · 1.80mm/px · 6 of 66 slices shown (3 of 4)]
[im 1/66]
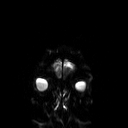
[im 14/66]
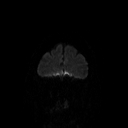
[im 27/66]
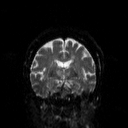
[im 40/66]
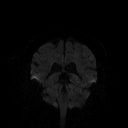
[im 53/66]
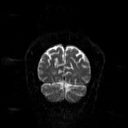
[im 66/66]
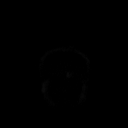

[Series 6: DWI · coronal · 5.0mm · 1.80mm/px · 3 of 34 slices shown (4 of 4)]
[im 1/34]
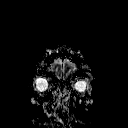
[im 17/34]
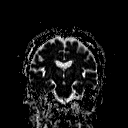
[im 34/34]
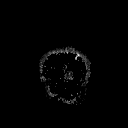

[Series 7: T2 · axial · 5.0mm · 0.51mm/px · z∈[-65,+82]mm · 2 of 22 slices shown (1 of 2)]
[im 1/22]
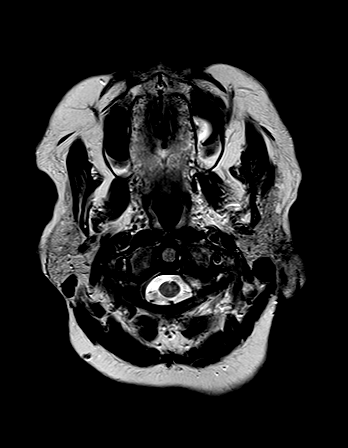
[im 22/22]
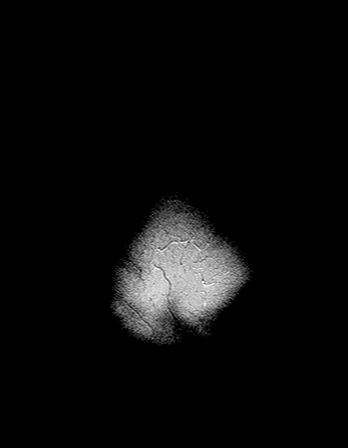

[Series 8: FLAIR · axial · 3.0mm · 0.45mm/px · z∈[-52,+82]mm · 3 of 30 slices shown]
[im 1/30]
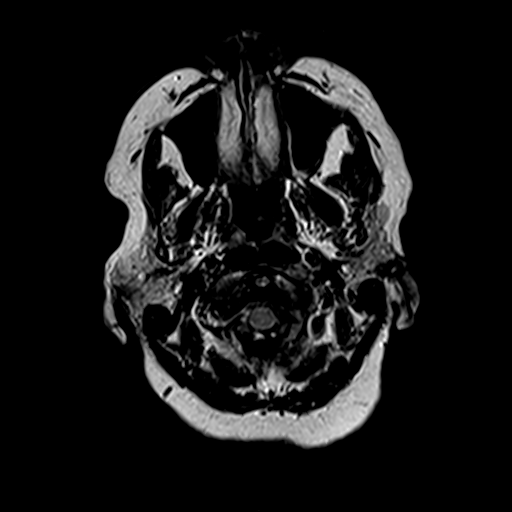
[im 15/30]
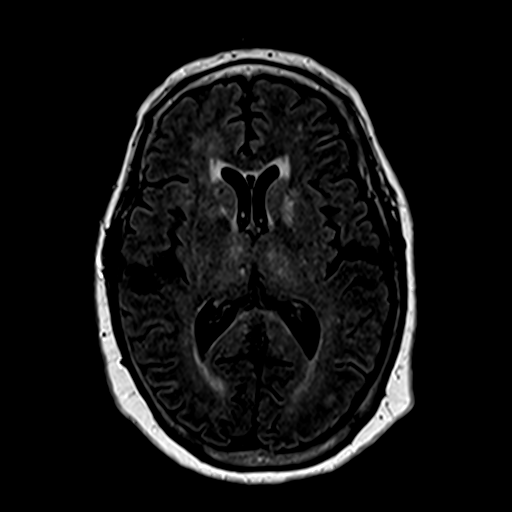
[im 30/30]
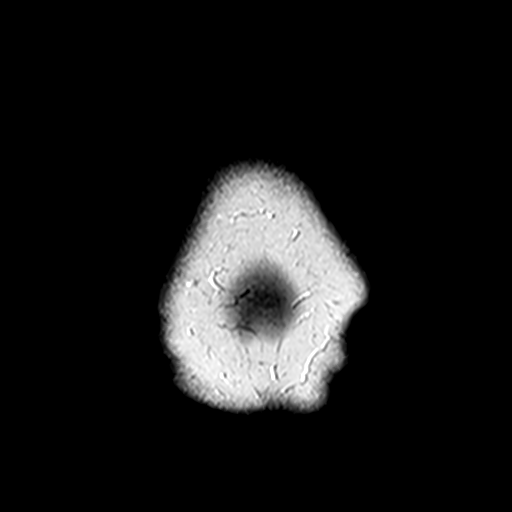

[Series 10: swi_images · axial · 4.0mm · 0.90mm/px · z∈[-73,+66]mm · 3 of 36 slices shown]
[im 1/36]
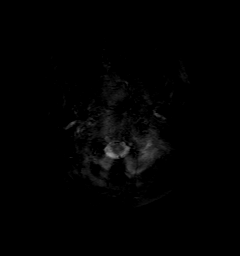
[im 18/36]
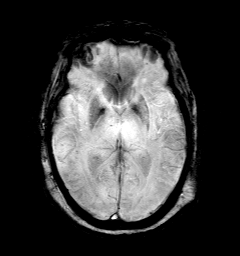
[im 36/36]
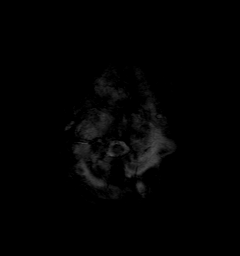

[Series 11: t1_mpr_tra · axial · 1.0mm · 0.71mm/px · z∈[-63,+80]mm · 13 of 144 slices shown]
[im 1/144]
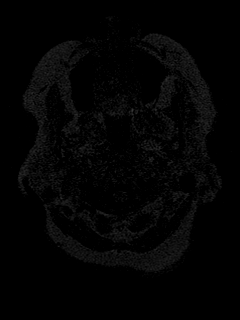
[im 12/144]
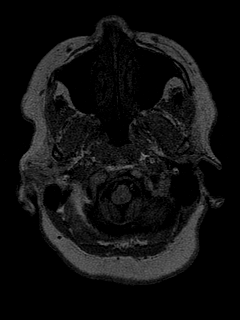
[im 24/144]
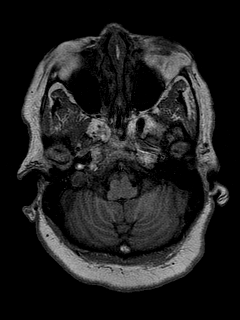
[im 36/144]
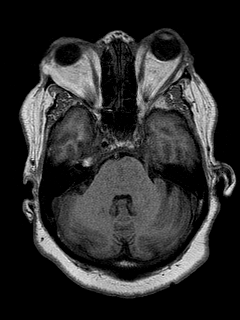
[im 48/144]
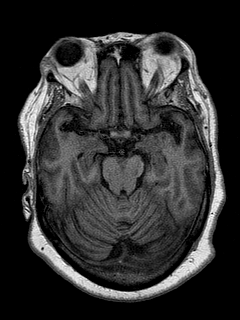
[im 60/144]
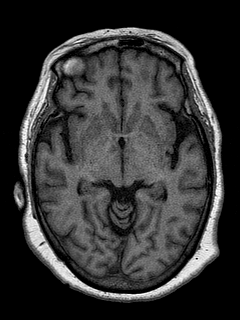
[im 72/144]
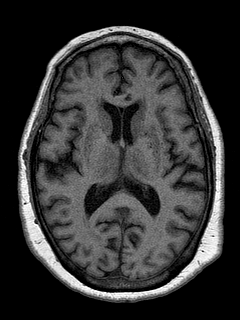
[im 84/144]
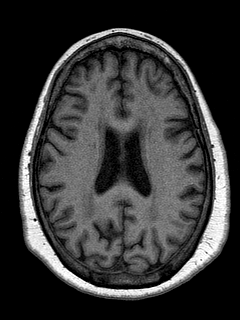
[im 96/144]
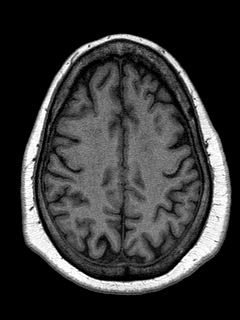
[im 108/144]
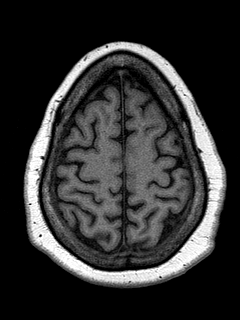
[im 120/144]
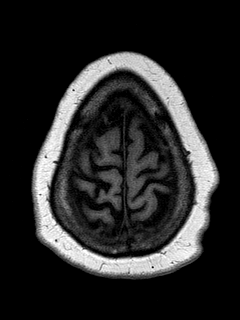
[im 132/144]
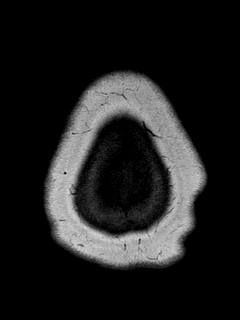
[im 144/144]
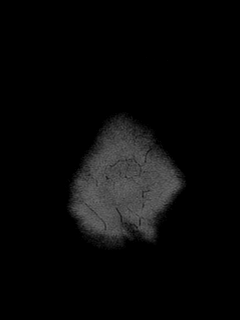

[Series 12: T2 · coronal · 5.0mm · 0.45mm/px · 2 of 27 slices shown (2 of 2)]
[im 1/27]
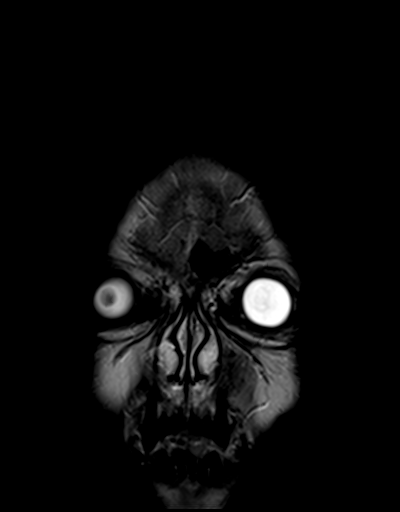
[im 27/27]
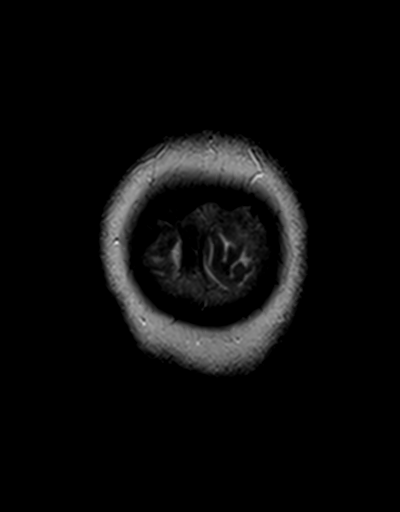

[48 of 48 positions shown; findings below may reference images not displayed]

FINDINGS: Brain:

Mild generalized cerebral atrophy without lobar predominance.

Moderate to moderately advanced multifocal T2/FLAIR hyperintensity
within the cerebral white matter. Additional, patchy and ill-defined
T2/FLAIR hyperintensity is present within the deep gray nuclei.
Findings are nonspecific, but compatible with chronic small vessel
ischemic disease.

Scattered small foci of supratentorial and infratentorial SWI signal
loss, greatest within the posterior cerebral hemispheres and
temporal lobes. Findings are compatible with chronic
microhemorrhages.

There is no acute infarct.

No extra-axial fluid collection.

No midline shift.

Vascular: Expected proximal arterial flow voids.

Skull and upper cervical spine: No focal marrow lesion. Cervical
spondylosis.

Sinuses/Orbits: Visualized orbits show no acute finding. Mild
ethmoid and left maxillary sinus mucosal thickening.
IMPRESSION: No evidence of acute intracranial abnormality, including acute
infarction.

Moderate to moderately advanced chronic small vessel ischemic
disease within the cerebral white matter and deep gray nuclei.

Supratentorial and infratentorial chronic microhemorrhages which may
be secondary to hypertensive microangiopathy and/or cerebral amyloid
angiopathy.

Mild cerebral atrophy.

Mild ethmoid and left maxillary sinus mucosal thickening.
